# Patient Record
Sex: Female | Born: 2000 | Race: Black or African American | Hispanic: No | Marital: Single | Smoking: Never smoker
Health system: Southern US, Community
[De-identification: ages and names within clinical notes are randomized; demographics above are authoritative.]

## PROBLEM LIST (undated history)

## (undated) DIAGNOSIS — F329 Major depressive disorder, single episode, unspecified: Secondary | ICD-10-CM

## (undated) DIAGNOSIS — J45909 Unspecified asthma, uncomplicated: Secondary | ICD-10-CM

## (undated) DIAGNOSIS — F32A Depression, unspecified: Secondary | ICD-10-CM

## (undated) DIAGNOSIS — F419 Anxiety disorder, unspecified: Secondary | ICD-10-CM

## (undated) HISTORY — PX: OTHER SURGICAL HISTORY: SHX169

## (undated) HISTORY — DX: Depression, unspecified: F32.A

## (undated) HISTORY — DX: Anxiety disorder, unspecified: F41.9

---
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
## 2022-03-30 ENCOUNTER — Ambulatory Visit (INDEPENDENT_AMBULATORY_CARE_PROVIDER_SITE_OTHER): Payer: Medicaid Other

## 2022-03-30 ENCOUNTER — Ambulatory Visit (INDEPENDENT_AMBULATORY_CARE_PROVIDER_SITE_OTHER): Payer: Medicaid Other | Admitting: Podiatry

## 2022-03-30 DIAGNOSIS — S9032XA Contusion of left foot, initial encounter: Secondary | ICD-10-CM | POA: Diagnosis not present
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
## 2022-06-13 DIAGNOSIS — Z0000 Encounter for general adult medical examination without abnormal findings: Secondary | ICD-10-CM | POA: Diagnosis not present

## 2022-06-13 DIAGNOSIS — N946 Dysmenorrhea, unspecified: Secondary | ICD-10-CM | POA: Diagnosis not present

## 2024-02-14 DIAGNOSIS — L509 Urticaria, unspecified: Secondary | ICD-10-CM | POA: Diagnosis not present

## 2024-04-26 DIAGNOSIS — F419 Anxiety disorder, unspecified: Secondary | ICD-10-CM | POA: Diagnosis not present

## 2024-04-26 DIAGNOSIS — Z5181 Encounter for therapeutic drug level monitoring: Secondary | ICD-10-CM | POA: Diagnosis not present

## 2024-04-26 DIAGNOSIS — F331 Major depressive disorder, recurrent, moderate: Secondary | ICD-10-CM | POA: Diagnosis not present

## 2024-05-01 DIAGNOSIS — J020 Streptococcal pharyngitis: Secondary | ICD-10-CM | POA: Diagnosis not present

## 2024-05-01 DIAGNOSIS — R509 Fever, unspecified: Secondary | ICD-10-CM | POA: Diagnosis not present

## 2024-05-01 DIAGNOSIS — J029 Acute pharyngitis, unspecified: Secondary | ICD-10-CM | POA: Diagnosis not present

## 2024-05-27 DIAGNOSIS — F331 Major depressive disorder, recurrent, moderate: Secondary | ICD-10-CM | POA: Diagnosis not present

## 2024-05-27 DIAGNOSIS — F419 Anxiety disorder, unspecified: Secondary | ICD-10-CM | POA: Diagnosis not present

## 2024-06-12 DIAGNOSIS — F331 Major depressive disorder, recurrent, moderate: Secondary | ICD-10-CM | POA: Diagnosis not present

## 2024-06-12 DIAGNOSIS — F419 Anxiety disorder, unspecified: Secondary | ICD-10-CM | POA: Diagnosis not present

## 2024-07-18 DIAGNOSIS — F331 Major depressive disorder, recurrent, moderate: Secondary | ICD-10-CM | POA: Diagnosis not present

## 2024-07-18 DIAGNOSIS — F419 Anxiety disorder, unspecified: Secondary | ICD-10-CM | POA: Diagnosis not present

## 2024-08-16 DIAGNOSIS — F331 Major depressive disorder, recurrent, moderate: Secondary | ICD-10-CM | POA: Diagnosis not present

## 2024-08-16 DIAGNOSIS — F419 Anxiety disorder, unspecified: Secondary | ICD-10-CM | POA: Diagnosis not present

## 2024-10-22 DIAGNOSIS — F419 Anxiety disorder, unspecified: Secondary | ICD-10-CM | POA: Diagnosis not present

## 2024-12-06 DIAGNOSIS — N926 Irregular menstruation, unspecified: Secondary | ICD-10-CM | POA: Diagnosis not present

## 2024-12-06 DIAGNOSIS — N946 Dysmenorrhea, unspecified: Secondary | ICD-10-CM | POA: Diagnosis not present

## 2024-12-09 ENCOUNTER — Other Ambulatory Visit: Payer: Self-pay | Admitting: Family Medicine

## 2024-12-09 DIAGNOSIS — N946 Dysmenorrhea, unspecified: Secondary | ICD-10-CM

## 2024-12-09 DIAGNOSIS — J209 Acute bronchitis, unspecified: Secondary | ICD-10-CM | POA: Diagnosis not present

## 2024-12-10 ENCOUNTER — Inpatient Hospital Stay: Admission: RE | Admit: 2024-12-10 | Discharge: 2024-12-10 | Attending: Family Medicine | Admitting: Family Medicine

## 2024-12-10 DIAGNOSIS — N946 Dysmenorrhea, unspecified: Secondary | ICD-10-CM
